# Patient Record
Sex: Female | Born: 1945 | Race: White | Hispanic: No | Marital: Married | Smoking: Never smoker
Health system: Southern US, Community
[De-identification: ages and names within clinical notes are randomized; demographics above are authoritative.]

## PROBLEM LIST (undated history)

## (undated) DIAGNOSIS — I10 Essential (primary) hypertension: Secondary | ICD-10-CM

## (undated) HISTORY — PX: ABDOMINAL HYSTERECTOMY: SHX81

## (undated) HISTORY — PX: TONSILLECTOMY: SUR1361

---

## 2011-05-23 ENCOUNTER — Ambulatory Visit: Payer: Self-pay | Admitting: Gynecology

## 2012-08-23 ENCOUNTER — Emergency Department: Payer: Self-pay | Admitting: Emergency Medicine

## 2015-04-18 ENCOUNTER — Emergency Department
Admission: EM | Admit: 2015-04-18 | Discharge: 2015-04-18 | Disposition: A | Discharge status: Home / Self Care | Payer: Medicare Other | Attending: Emergency Medicine | Admitting: Emergency Medicine

## 2015-04-18 ENCOUNTER — Encounter: Payer: Self-pay | Admitting: Emergency Medicine

## 2015-04-18 DIAGNOSIS — Y9389 Activity, other specified: Secondary | ICD-10-CM | POA: Insufficient documentation

## 2015-04-18 DIAGNOSIS — Z791 Long term (current) use of non-steroidal anti-inflammatories (NSAID): Secondary | ICD-10-CM | POA: Diagnosis not present

## 2015-04-18 DIAGNOSIS — R531 Weakness: Secondary | ICD-10-CM | POA: Insufficient documentation

## 2015-04-18 DIAGNOSIS — S8992XA Unspecified injury of left lower leg, initial encounter: Secondary | ICD-10-CM | POA: Diagnosis not present

## 2015-04-18 DIAGNOSIS — Z79899 Other long term (current) drug therapy: Secondary | ICD-10-CM | POA: Diagnosis not present

## 2015-04-18 DIAGNOSIS — I10 Essential (primary) hypertension: Secondary | ICD-10-CM | POA: Diagnosis not present

## 2015-04-18 DIAGNOSIS — Z880 Allergy status to penicillin: Secondary | ICD-10-CM | POA: Insufficient documentation

## 2015-04-18 DIAGNOSIS — R29898 Other symptoms and signs involving the musculoskeletal system: Secondary | ICD-10-CM

## 2015-04-18 DIAGNOSIS — Y9289 Other specified places as the place of occurrence of the external cause: Secondary | ICD-10-CM | POA: Insufficient documentation

## 2015-04-18 DIAGNOSIS — R251 Tremor, unspecified: Secondary | ICD-10-CM | POA: Diagnosis present

## 2015-04-18 DIAGNOSIS — W1839XA Other fall on same level, initial encounter: Secondary | ICD-10-CM | POA: Diagnosis not present

## 2015-04-18 DIAGNOSIS — Y998 Other external cause status: Secondary | ICD-10-CM | POA: Diagnosis not present

## 2015-04-18 HISTORY — DX: Essential (primary) hypertension: I10

## 2015-04-18 LAB — COMPREHENSIVE METABOLIC PANEL
ALT: 12 U/L — ABNORMAL LOW (ref 14–54)
ANION GAP: 7 (ref 5–15)
AST: 14 U/L — AB (ref 15–41)
Albumin: 4.2 g/dL (ref 3.5–5.0)
Alkaline Phosphatase: 60 U/L (ref 38–126)
BILIRUBIN TOTAL: 1.3 mg/dL — AB (ref 0.3–1.2)
BUN: 18 mg/dL (ref 6–20)
CALCIUM: 9.1 mg/dL (ref 8.9–10.3)
CO2: 25 mmol/L (ref 22–32)
CREATININE: 0.78 mg/dL (ref 0.44–1.00)
Chloride: 110 mmol/L (ref 101–111)
Glucose, Bld: 108 mg/dL — ABNORMAL HIGH (ref 65–99)
Potassium: 4.2 mmol/L (ref 3.5–5.1)
SODIUM: 142 mmol/L (ref 135–145)
TOTAL PROTEIN: 7.4 g/dL (ref 6.5–8.1)

## 2015-04-18 LAB — CBC WITH DIFFERENTIAL/PLATELET
BASOS ABS: 0 10*3/uL (ref 0–0.1)
BASOS PCT: 0 %
EOS ABS: 0.2 10*3/uL (ref 0–0.7)
Eosinophils Relative: 1 %
HEMATOCRIT: 40.6 % (ref 35.0–47.0)
HEMOGLOBIN: 13.6 g/dL (ref 12.0–16.0)
Lymphocytes Relative: 22 %
Lymphs Abs: 2.7 10*3/uL (ref 1.0–3.6)
MCH: 30.8 pg (ref 26.0–34.0)
MCHC: 33.6 g/dL (ref 32.0–36.0)
MCV: 91.6 fL (ref 80.0–100.0)
MONOS PCT: 7 %
Monocytes Absolute: 0.9 10*3/uL (ref 0.2–0.9)
NEUTROS ABS: 8.8 10*3/uL — AB (ref 1.4–6.5)
NEUTROS PCT: 70 %
Platelets: 218 10*3/uL (ref 150–440)
RBC: 4.43 MIL/uL (ref 3.80–5.20)
RDW: 14.4 % (ref 11.5–14.5)
WBC: 12.6 10*3/uL — AB (ref 3.6–11.0)

## 2015-04-18 NOTE — ED Notes (Signed)
Pt reports tremors in her Right leg from calf down that started mildly approx 3 weeks ago.  Pt states today the tremors have gotten more intense and longer, to the point that she has fallen 3 times today. Pt denies hitting her head.

## 2015-04-18 NOTE — ED Provider Notes (Signed)
[facility] Emergency Department Provider Note  ____________________________________________  Time seen: Approximately 4:23 PM  I have reviewed the triage vital signs and the nursing notes.   HISTORY  Chief Complaint Tremors and Fall    HPI Marie Dean is a 70 y.o. female patient reports that for several weeks she's been having episodes where her right lower leg will shake and get weak or numb and she'll fall. The helping happening with increasing frequency. Dr. Hyacinth Meeker orthopedics has gotten an MRI of her lumbar spine which did not show any pathology that would explain the problem. Dr. Arlana Pouch her family doctor has arranged for follow-up with neurology and vascular surgery however patient had 3 episodes today where she fell and hurt herself so she came into the emergency room to see if it could be done sooner. Patient has no other problems. Patient does have some tenderness in the left knee from falling previously that didn't evaluated by Dr. Hyacinth Meeker. Patient's only other complaint is a history of hypertension.  Past Medical History  Diagnosis Date  . Hypertension     There are no active problems to display for this patient.   Past Surgical History  Procedure Laterality Date  . Abdominal hysterectomy    . Tonsillectomy      Current Outpatient Rx  Name  Route  Sig  Dispense  Refill  . acetaminophen (TYLENOL) 500 MG tablet   Oral   Take 500 mg by mouth every 6 (six) hours as needed for mild pain, fever or headache.         . carvedilol (COREG) 25 MG tablet   Oral   Take 25 mg by mouth 2 (two) times daily with a meal.         . doxazosin (CARDURA) 4 MG tablet   Oral   Take 4 mg by mouth at bedtime.         Marland Kitchen estradiol (ESTRACE) 0.1 MG/GM vaginal cream   Vaginal   Place 1 Applicatorful vaginally every 3 (three) days.         . fluticasone (FLONASE) 50 MCG/ACT nasal spray   Each Nare   Place 1 spray into both nostrils daily as needed for  rhinitis.         Marland Kitchen gabapentin (NEURONTIN) 300 MG capsule   Oral   Take 300 mg by mouth 4 (four) times daily as needed (for pain).         . irbesartan (AVAPRO) 150 MG tablet   Oral   Take 150 mg by mouth 2 (two) times daily.         . meloxicam (MOBIC) 15 MG tablet   Oral   Take 15 mg by mouth daily.         Marland Kitchen zolpidem (AMBIEN) 10 MG tablet   Oral   Take 10 mg by mouth at bedtime.           Allergies Penicillins  No family history on file.  Social History Social History  Substance Use Topics  . Smoking status: Never Smoker   . Smokeless tobacco: None  . Alcohol Use: No    Review of Systems Constitutional: No fever/chills Eyes: No visual changes. ENT: No sore throat. Cardiovascular: Denies chest pain. Respiratory: Denies shortness of breath. Gastrointestinal: No abdominal pain.  No nausea, no vomiting.  No diarrhea.  No constipation. Genitourinary: Negative for dysuria. Musculoskeletal: Negative for back pain. Skin: Negative for rash. Neurological: Negative for headaches, focal weakness or numbness.  10-point ROS otherwise negative.  ____________________________________________   PHYSICAL EXAM:  VITAL SIGNS: ED Triage Vitals  Enc Vitals Group     BP 04/18/15 1332 192/71 mmHg     Pulse Rate 04/18/15 1332 81     Resp --      Temp 04/18/15 1332 98.1 F (36.7 C)     Temp Source 04/18/15 1332 Oral     SpO2 04/18/15 1332 96 %     Weight 04/18/15 1332 150 lb (68.04 kg)     Height 04/18/15 1332 5\' 4"  (1.626 m)     Head Cir --      Peak Flow --      Pain Score --      Pain Loc --      Pain Edu? --      Excl. in GC? --     Constitutional: Alert and oriented. Well appearing and in no acute distress. Eyes: Conjunctivae are normal. PERRL. EOMI. Head: Atraumatic. Nose: No congestion/rhinnorhea. Mouth/Throat: Mucous membranes are moist.  Oropharynx non-erythematous. Neck: No stridor.  Cardiovascular: Normal rate, regular rhythm. Grossly normal  heart sounds.  Good peripheral circulation. Respiratory: Normal respiratory effort.  No retractions. Lungs CTAB. Gastrointestinal: Soft and nontender. No distention. No abdominal bruits. No CVA tenderness. Musculoskeletal: No lower extremity tenderness nor edema.  No joint effusions. Neurologic:  Normal speech and language. No gross focal neurologic deficits are appreciated. Cranial nerves II through XII are intact cerebellar finger-nose and rapid alternating movements and hands are normal motor strength is 5 over 5 throughout except for some mild leg weakness which is due to the pain from her knee injury. DTRs are equal in both knees and good in the right left ankle somewhat decreased No gait instability. Skin:  Skin is warm, dry and intact. No rash noted. Psychiatric: Mood and affect are normal. Speech and behavior are normal.  ____________________________________________   LABS (all labs ordered are listed, but only abnormal results are displayed)  Labs Reviewed  COMPREHENSIVE METABOLIC PANEL - Abnormal; Notable for the following:    Glucose, Bld 108 (*)    AST 14 (*)    ALT 12 (*)    Total Bilirubin 1.3 (*)    All other components within normal limits  CBC WITH DIFFERENTIAL/PLATELET - Abnormal; Notable for the following:    WBC 12.6 (*)    Neutro Abs 8.8 (*)    All other components within normal limits   ____________________________________________  EKG   ____________________________________________  RADIOLOGY   ____________________________________________   PROCEDURES  Discussed the patient with Dr. Thad Ranger neurology. She recommended an MRI of the L-spine but then patient as I said reported MRI was negative within the last 3 weeks. I will do a telephone neurology consult is Dr. Thad Ranger cannot come in to see the patient now.  ____________________________________________   INITIAL IMPRESSION / ASSESSMENT AND PLAN / ED COURSE  Pertinent labs & imaging results that  were available during my care of the patient were reviewed by me and considered in my medical decision making (see chart for details).  Discussed in detail with specialist on-call specialist on call recommends head MRI Doppler of the right leg and may need EMG later. Patient reviewed these items and that she's been here all day wants to go home we'll do that with Dr. Malvin Johns when she sees him this coming week. The specialist on-call thinks that patient probably has postural tremor much less likely is a focal seizure or weakness due to a vascular Occlusion.  ____________________________________________   FINAL CLINICAL IMPRESSION(S) / ED DIAGNOSES  Final diagnoses:  Leg weakness      Arnaldo Natal, MD 04/18/15 2009

## 2015-04-18 NOTE — ED Notes (Signed)
MD at bedside. 

## 2015-04-18 NOTE — ED Notes (Signed)
Patient speaking with [facility]. 

## 2015-04-18 NOTE — Discharge Instructions (Signed)
Please return if worse. Please follow-up with Dr. Malvin Johns in the vascular surgeon as planned. Both of them should be on to see all the the work that we did today in the ER as well as get the report of the specialist on-call. Please be very very careful not to walk away from your walker as I said if you fall you could break a hip or hit her head and have some bleeding both of these could be actually fatal for you do not walk away from your walker.

## 2016-06-18 ENCOUNTER — Other Ambulatory Visit: Payer: Self-pay | Admitting: Physician Assistant

## 2016-06-18 DIAGNOSIS — M2392 Unspecified internal derangement of left knee: Secondary | ICD-10-CM

## 2016-07-09 ENCOUNTER — Ambulatory Visit
Admission: RE | Admit: 2016-07-09 | Discharge: 2016-07-09 | Disposition: A | Discharge status: Home / Self Care | Payer: Medicare Other | Source: Ambulatory Visit | Attending: Physician Assistant | Admitting: Physician Assistant

## 2016-07-09 DIAGNOSIS — M2392 Unspecified internal derangement of left knee: Secondary | ICD-10-CM | POA: Diagnosis not present

## 2018-05-09 IMAGING — MR MR KNEE*L* W/O CM
6 series · 39 of 40 positions shown · non-contrast
Comparison: None.

CLINICAL DATA: Lateral left knee pain and swelling for 1 year. No
known injury.

EXAM:
MRI OF THE LEFT KNEE WITHOUT CONTRAST
TECHNIQUE: Multiplanar, multisequence MR imaging of the knee was performed. No
intravenous contrast was administered.

[Series 3: PD fat-sat · axial · 3.0mm · 0.33mm/px · z∈[-62,+51]mm · 9 of 35 slices shown (1 of 4)]
[im 1/35]
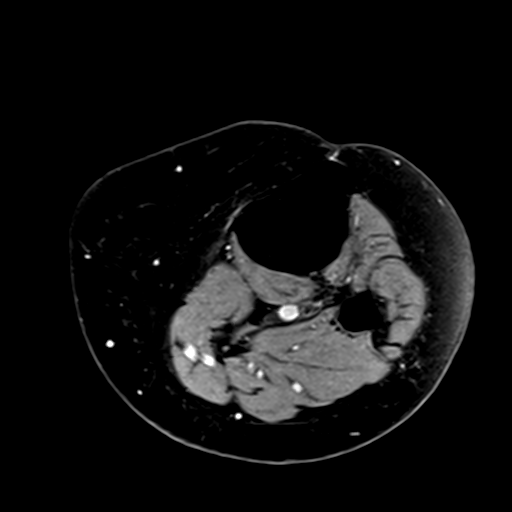
[im 5/35]
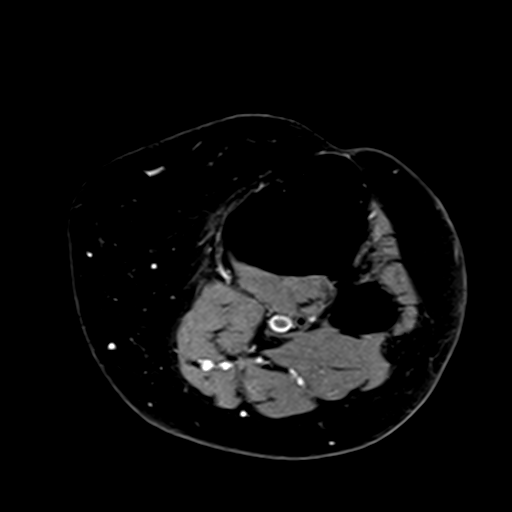
[im 9/35]
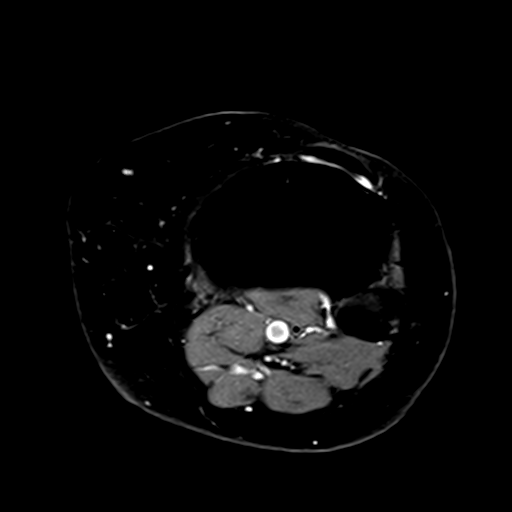
[im 13/35]
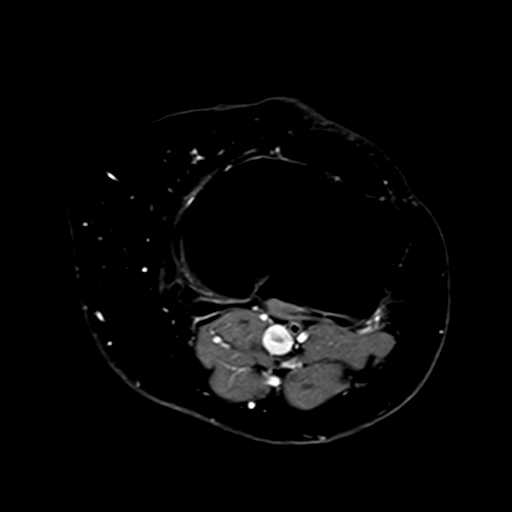
[im 18/35]
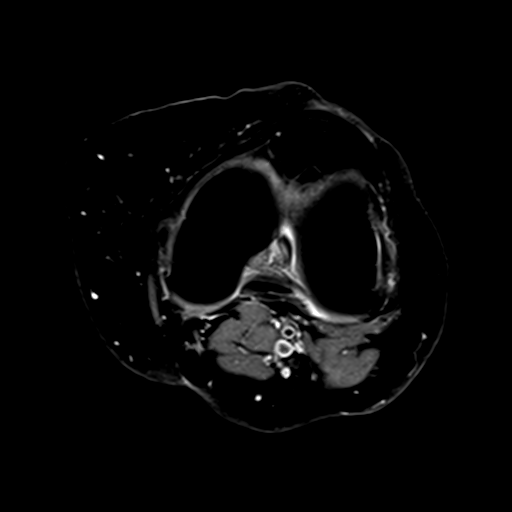
[im 22/35]
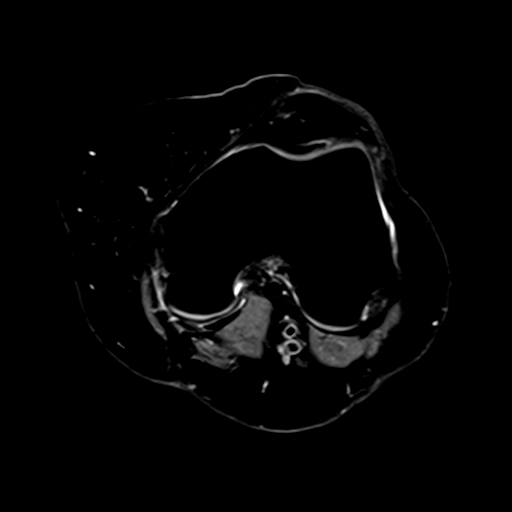
[im 26/35]
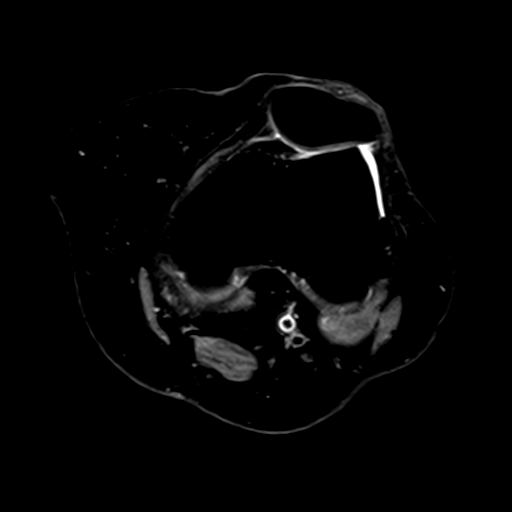
[im 30/35]
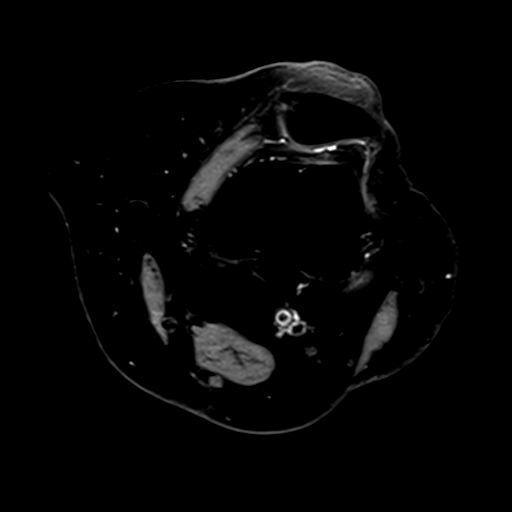
[im 35/35]
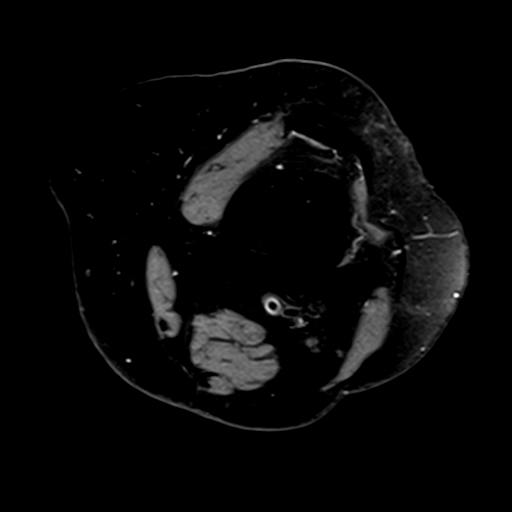

[Series 4: T1 · coronal · 3.0mm · 0.50mm/px · 7 of 31 slices shown]
[im 1/31]
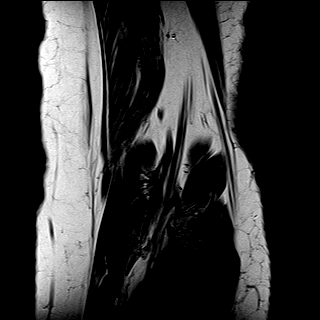
[im 5/31]
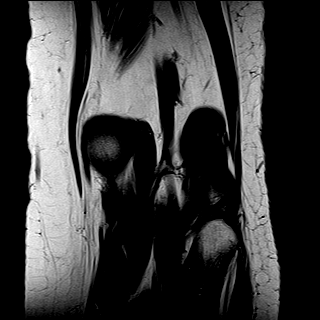
[im 9/31]
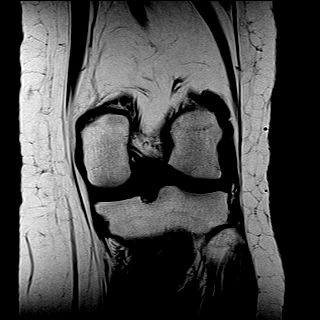
[im 13/31]
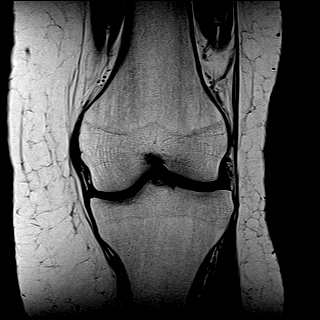
[im 18/31]
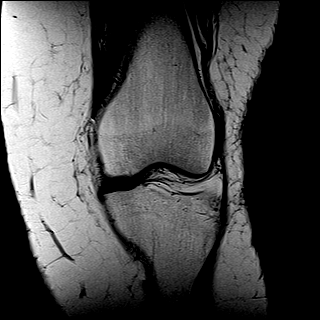
[im 22/31]
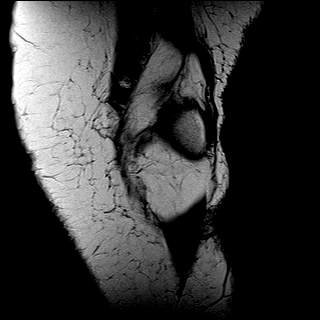
[im 26/31]
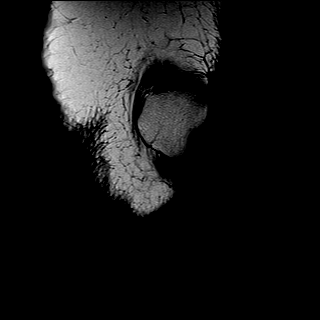

[Series 5: T2 fat-sat · coronal · 3.0mm · 0.50mm/px · 7 of 31 slices shown]
[im 1/31]
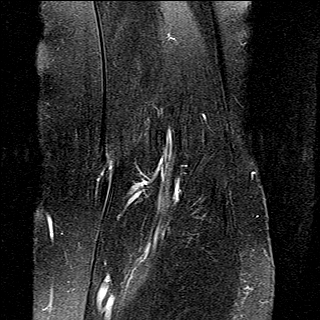
[im 6/31]
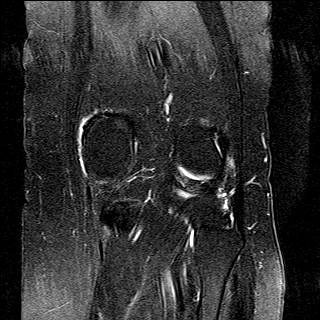
[im 11/31]
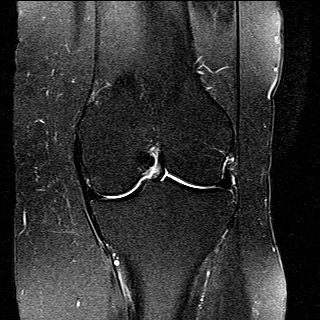
[im 16/31]
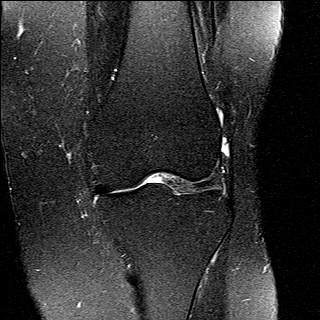
[im 21/31]
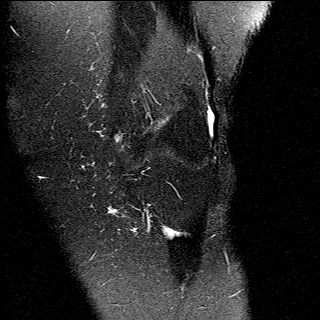
[im 26/31]
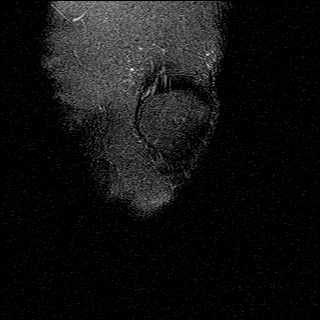
[im 31/31]
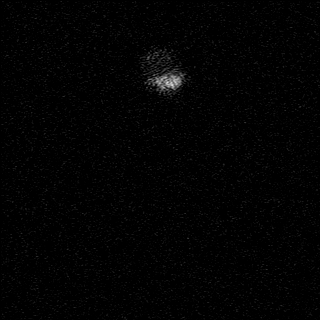

[Series 6: PD fat-sat · coronal · 3.0mm · 0.62mm/px · 7 of 31 slices shown (2 of 4)]
[im 1/31]
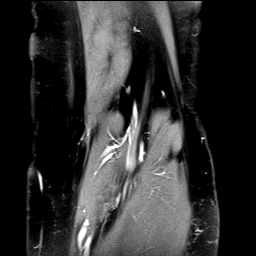
[im 6/31]
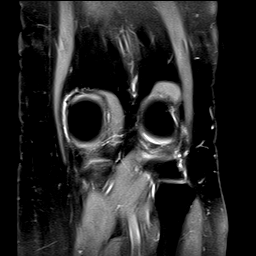
[im 11/31]
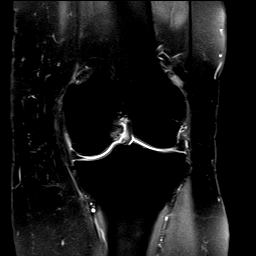
[im 16/31]
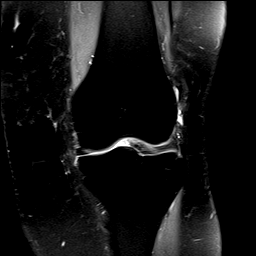
[im 21/31]
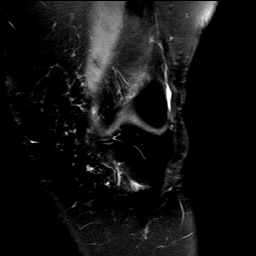
[im 26/31]
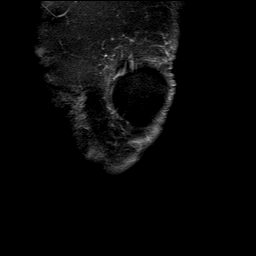
[im 31/31]
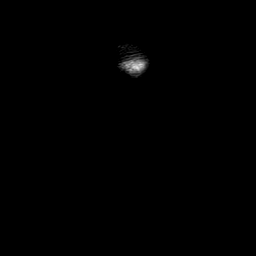

[Series 7: PD fat-sat · sagittal · 3.0mm · 0.62mm/px · 7 of 31 slices shown (3 of 4)]
[im 1/31]
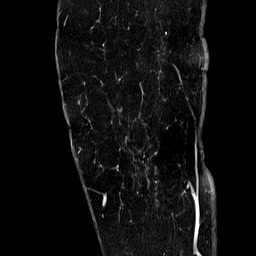
[im 6/31]
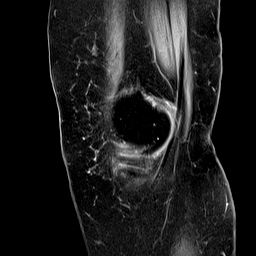
[im 11/31]
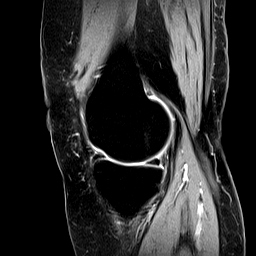
[im 16/31]
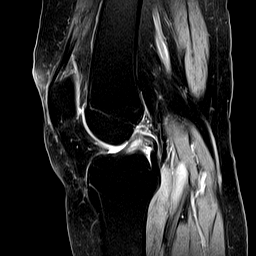
[im 21/31]
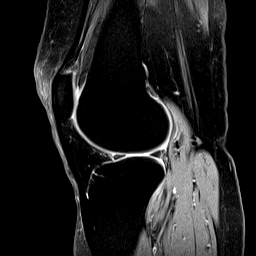
[im 26/31]
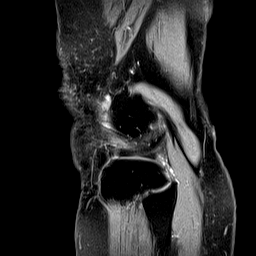
[im 31/31]
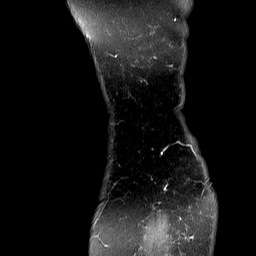

[Series 8: PD fat-sat · oblique · 2.0mm · 0.62mm/px · 2 of 10 slices shown (4 of 4)]
[im 1/10]
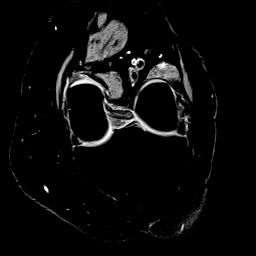
[im 10/10]
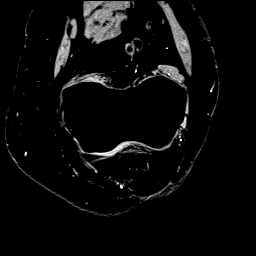

[39 of 40 positions shown; findings below may reference images not displayed]

FINDINGS: MENISCI

Medial meniscus:  Intact.

Lateral meniscus:  Intact.

LIGAMENTS

Cruciates:  Intact.

Collaterals:  Intact.

CARTILAGE

Patellofemoral:  Normal.

Medial:  Minimally degenerated.

Lateral:  Normal.

Joint:  No effusion.

Popliteal Fossa:  No Baker's cyst.

Extensor Mechanism:  Intact.

Bones:  Normal marrow signal throughout.

Other: None.
IMPRESSION: Normal MRI left knee.

## 2019-10-17 ENCOUNTER — Ambulatory Visit: Payer: Self-pay | Attending: Internal Medicine

## 2019-10-17 DIAGNOSIS — Z23 Encounter for immunization: Secondary | ICD-10-CM

## 2019-10-17 NOTE — Progress Notes (Signed)
   Covid-19 Vaccination Clinic  Name:  DORINA RIBAUDO    MRN: [id number] DOB: 08/09/1945  10/17/2019  Ms. Getty was observed post Covid-19 immunization for 15 minutes without incident. She was provided with Vaccine Information Sheet and instruction to access the V-Safe system.   Ms. Ausburn was instructed to call 911 with any severe reactions post vaccine: Marland Kitchen Difficulty breathing  . Swelling of face and throat  . A fast heartbeat  . A bad rash all over body  . Dizziness and weakness

## 2022-01-01 ENCOUNTER — Emergency Department: Payer: Medicare Other

## 2022-01-01 ENCOUNTER — Emergency Department
Admission: EM | Admit: 2022-01-01 | Discharge: 2022-01-01 | Disposition: A | Discharge status: Home / Self Care | Payer: Medicare Other | Attending: Emergency Medicine | Admitting: Emergency Medicine

## 2022-01-01 ENCOUNTER — Other Ambulatory Visit: Payer: Self-pay

## 2022-01-01 DIAGNOSIS — R200 Anesthesia of skin: Secondary | ICD-10-CM | POA: Insufficient documentation

## 2022-01-01 DIAGNOSIS — I10 Essential (primary) hypertension: Secondary | ICD-10-CM | POA: Diagnosis not present

## 2022-01-01 DIAGNOSIS — D72829 Elevated white blood cell count, unspecified: Secondary | ICD-10-CM | POA: Insufficient documentation

## 2022-01-01 LAB — COMPREHENSIVE METABOLIC PANEL
ALT: 13 U/L (ref 0–44)
AST: 18 U/L (ref 15–41)
Albumin: 3.8 g/dL (ref 3.5–5.0)
Alkaline Phosphatase: 54 U/L (ref 38–126)
Anion gap: 11 (ref 5–15)
BUN: 29 mg/dL — ABNORMAL HIGH (ref 8–23)
CO2: 25 mmol/L (ref 22–32)
Calcium: 9 mg/dL (ref 8.9–10.3)
Chloride: 103 mmol/L (ref 98–111)
Creatinine, Ser: 1.31 mg/dL — ABNORMAL HIGH (ref 0.44–1.00)
GFR, Estimated: 42 mL/min — ABNORMAL LOW (ref >60)
Glucose, Bld: 169 mg/dL — ABNORMAL HIGH (ref 70–99)
Potassium: 3.7 mmol/L (ref 3.5–5.1)
Sodium: 139 mmol/L (ref 135–145)
Total Bilirubin: 1.4 mg/dL — ABNORMAL HIGH (ref 0.3–1.2)
Total Protein: 6.9 g/dL (ref 6.5–8.1)

## 2022-01-01 LAB — CBC
HCT: 38.4 % (ref 36.0–46.0)
Hemoglobin: 12.9 g/dL (ref 12.0–15.0)
MCH: 30 pg (ref 26.0–34.0)
MCHC: 33.6 g/dL (ref 30.0–36.0)
MCV: 89.3 fL (ref 80.0–100.0)
Platelets: 237 10*3/uL (ref 150–400)
RBC: 4.3 MIL/uL (ref 3.87–5.11)
RDW: 13.2 % (ref 11.5–15.5)
WBC: 20.7 10*3/uL — ABNORMAL HIGH (ref 4.0–10.5)
nRBC: 0 % (ref 0.0–0.2)

## 2022-01-01 LAB — URINALYSIS, ROUTINE W REFLEX MICROSCOPIC
Bilirubin Urine: NEGATIVE
Glucose, UA: NEGATIVE mg/dL
Ketones, ur: NEGATIVE mg/dL
Leukocytes,Ua: NEGATIVE
Nitrite: NEGATIVE
Protein, ur: 30 mg/dL — AB
Specific Gravity, Urine: 1.023 (ref 1.005–1.030)
pH: 5 (ref 5.0–8.0)

## 2022-01-01 LAB — LIPASE, BLOOD: Lipase: 36 U/L (ref 11–51)

## 2022-01-01 MED ORDER — LORAZEPAM 1 MG PO TABS
1.0000 mg | ORAL_TABLET | Freq: Once | ORAL | Status: DC | PRN
  Filled 2022-01-01: qty 1

## 2022-01-01 MED ORDER — LORAZEPAM 1 MG PO TABS: 1.0000 mg | ORAL_TABLET | Freq: Once | ORAL | Status: DC | PRN

## 2022-01-01 NOTE — Discharge Instructions (Signed)
Follow-up tomorrow with Dr. Malvin Johns at 230 as scheduled.  Take the gabapentin as prescribed until then.  Return to the ER immediately for new or worsening numbness, weakness, recurrent falls, or any other new or worsening symptoms that concern you.

## 2022-01-01 NOTE — ED Notes (Signed)
Pt on phone with MRI screener. 

## 2022-01-01 NOTE — ED Triage Notes (Addendum)
C/O N/V since last night.  Ongoing c/o weakness, left knee pain.  Has appointment with Neurology tomorrow.  Patient states she fell off of the commode this afternoon.  Denies head trauma or LOC  AAOx3.  Skin warm and dry. NAD

## 2022-01-01 NOTE — ED Notes (Signed)
See triage note. Pt has appt with neurologist tomorrow for episodes of weakness. Today legs got weak and fell off toilet. This is the first time she has fallen. Pt denies injuries. Alert and oriented, family at bedside.

## 2022-01-01 NOTE — ED Provider Notes (Signed)
[facility] Provider Note    Event Date/Time   First MD Initiated Contact with Patient 01/01/22 1528     (approximate)   History   Nausea   HPI  Marie Dean is a 76 y.o. female with a history of hypertension who presents with leg numbness which has been present for months but worsened over the last several weeks.  The numbness is mainly in her right foot and in the lower left leg.  She has associated intermittent difficulty walking and is often in a wheelchair.  The patient was started on gabapentin and prednisone on 11/8 and states that the symptoms have improved somewhat although are still present.  Today she had some diarrhea, got on the commode, and then her legs started shaking causing her to fall off of it.  She did not hit her head.  She denies any injuries.  She denies any nausea or vomiting today that was noted in the triage note.  She has no abdominal pain or fever.  She denies any back pain.  She has mild left-sided neck pain that has also been present for few weeks.  She is scheduled to see a neurologist tomorrow.  The family member states that the patient has become slightly more confused and weak since she was started on those medications and he is wondering if the dosage may need to be adjusted.  She just has 1 more day of prednisone.  I reviewed the past medical records; I confirmed the patient's most recent outpatient encounter was on 11/8 for evaluation of foot and left leg numbness treated with gabapentin and prednisone.   Physical Exam   Triage Vital Signs: ED Triage Vitals  Enc Vitals Group     BP 01/01/22 1415 110/65     Pulse Rate 01/01/22 1415 71     Resp 01/01/22 1415 16     Temp 01/01/22 1415 97.7 F (36.5 C)     Temp Source 01/01/22 1415 Oral     SpO2 01/01/22 1415 98 %     Weight 01/01/22 1414 149 lb 14.6 oz (68 kg)     Height 01/01/22 1414 5\' 4"  (1.626 m)     Head Circumference --      Peak Flow --      Pain Score  01/01/22 1414 0     Pain Loc --      Pain Edu? --      Excl. in GC? --     Most recent vital signs: Vitals:   01/01/22 1600 01/01/22 1630  BP: (!) 146/62 (!) 147/55  Pulse: 62 61  Resp: 17 18  Temp:    SpO2: 97% 97%     General: Alert and oriented, no distress.  CV:  Good peripheral perfusion.  Resp:  Normal effort.  Abd:  No distention.  Other:  5/5 motor strength to bilateral lower extremities proximal and distal.  Subjective decreased sensation to bilateral lower legs and feet.  Normal color and cap refill.  2+ DP pulses bilaterally.  No significant edema.  Left trapezius area with mild muscle tenderness.  No midline cervical spinal tenderness.  Full range of motion of the neck.  Normal motor strength bilateral upper extremities.   ED Results / Procedures / Treatments   Labs (all labs ordered are listed, but only abnormal results are displayed) Labs Reviewed  COMPREHENSIVE METABOLIC PANEL - Abnormal; Notable for the following components:      Result Value   Glucose,  Bld 169 (*)    BUN 29 (*)    Creatinine, Ser 1.31 (*)    Total Bilirubin 1.4 (*)    GFR, Estimated 42 (*)    All other components within normal limits  CBC - Abnormal; Notable for the following components:   WBC 20.7 (*)    All other components within normal limits  URINALYSIS, ROUTINE W REFLEX MICROSCOPIC - Abnormal; Notable for the following components:   Color, Urine AMBER (*)    APPearance CLOUDY (*)    Hgb urine dipstick MODERATE (*)    Protein, ur 30 (*)    Bacteria, UA MANY (*)    All other components within normal limits  LIPASE, BLOOD     EKG     RADIOLOGY  MR lumbar spine:   IMPRESSION:  1. Mild lumbar degenerative change. Negative for fracture.  2. Shallow left-sided disc protrusion L3-4 with mild left  subarticular stenosis.  3. Mild subarticular stenosis bilaterally L4-5.    PROCEDURES:  Critical Care performed: No  Procedures   MEDICATIONS ORDERED IN  ED: Medications  LORazepam (ATIVAN) tablet 1 mg (has no administration in time range)     IMPRESSION / MDM / ASSESSMENT AND PLAN / ED COURSE  I reviewed the triage vital signs and the nursing notes.  76 year old female with PMH as noted above presents with persistent bilateral lower extremity numbness and difficulty ambulating which has not significantly improved after starting gabapentin and steroids last week.  This caused her to fall out of the commode today.  Physical exam reveals decreased sensation of bilateral distal lower extremities although normal motor strength on my exam.  Differential diagnosis includes, but is not limited to, sciatica, lumbar radiculopathy, peripheral neuropathy due to other etiologies, electrolyte abnormality, dehydration, other metabolic disturbance, UTI or less likely other infection.  Initial lab work-up is significant for leukocytosis although this may be caused by the steroid.  The electrolytes are normal.  We will obtain a lumbar MRI due to the persistent and possibly slightly worsened symptoms to rule out an acute condition that would require intervention.  If this is negative I anticipate discharge with neurology follow-up tomorrow as scheduled.  Patient's presentation is most consistent with acute complicated illness / injury requiring diagnostic workup.  ----------------------------------------- 6:04 PM on 01/01/2022 -----------------------------------------  MR shows some left-sided disc protrusion but no other significant acute findings.  Urinalysis is negative except for teary, but is not consistent with UTI.  Overall I suspect that the leukocytosis is due to the steroid course.  The patient has not had any acute symptoms since she has been in the ED.  At this time, the patient is stable for discharge.  She is following up with Dr. Malvin Johns from neurology tomorrow afternoon.  She will continue the gabapentin until then.  I counseled her and her  family member on the results of the work-up and plan of care.  I gave strict return precautions and they expressed understanding.   FINAL CLINICAL IMPRESSION(S) / ED DIAGNOSES   Final diagnoses:  Leg numbness     Rx / DC Orders   ED Discharge Orders     None        Note:  This document was prepared using Dragon voice recognition software and may include unintentional dictation errors.    Dionne Bucy, MD 01/01/22 [phone]

## 2022-01-06 ENCOUNTER — Other Ambulatory Visit: Payer: Self-pay | Admitting: Student

## 2022-01-06 DIAGNOSIS — F4489 Other dissociative and conversion disorders: Secondary | ICD-10-CM

## 2022-02-18 ENCOUNTER — Other Ambulatory Visit (INDEPENDENT_AMBULATORY_CARE_PROVIDER_SITE_OTHER): Payer: Self-pay | Admitting: Neurology

## 2022-02-18 DIAGNOSIS — I739 Peripheral vascular disease, unspecified: Secondary | ICD-10-CM

## 2022-02-18 DIAGNOSIS — R531 Weakness: Secondary | ICD-10-CM

## 2022-02-19 ENCOUNTER — Ambulatory Visit (INDEPENDENT_AMBULATORY_CARE_PROVIDER_SITE_OTHER): Payer: Medicare Other

## 2022-02-19 DIAGNOSIS — I739 Peripheral vascular disease, unspecified: Secondary | ICD-10-CM

## 2022-02-19 DIAGNOSIS — R531 Weakness: Secondary | ICD-10-CM | POA: Diagnosis not present
